# Patient Record
Sex: Male | Born: 2000 | Race: Black or African American | Hispanic: No | Marital: Single | Smoking: Current every day smoker
Health system: Southern US, Community
[De-identification: ages and names within clinical notes are randomized; demographics above are authoritative.]

---
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
## 2024-06-06 ENCOUNTER — Encounter (HOSPITAL_COMMUNITY): Payer: Self-pay | Admitting: *Deleted

## 2024-06-06 ENCOUNTER — Ambulatory Visit (HOSPITAL_COMMUNITY): Admission: EM | Admit: 2024-06-06 | Discharge: 2024-06-06 | Disposition: A | Discharge status: Home / Self Care

## 2024-06-06 DIAGNOSIS — K5900 Constipation, unspecified: Secondary | ICD-10-CM

## 2024-06-06 MED ORDER — POLYETHYLENE GLYCOL 3350 17 G PO PACK: 17.0000 g | PACK | Freq: Every day | ORAL | 0 refills | Status: AC

## 2024-06-06 NOTE — ED Triage Notes (Signed)
 Pt states no BM x 2 days and couldn't sleep last night. Today his mom made him some tea which produced a small BM. He would like something to be sent to the pharmacy to clean him out.

## 2024-06-06 NOTE — Discharge Instructions (Signed)
" °  1. Constipation, unspecified constipation type (Primary) - polyethylene glycol (MIRALAX ) 17 g packet; Take 17 g by mouth daily.  Dispense: 14 each; Refill: 0 - You can also pick up at the pharmacy saline enema or soapsuds enema to help lubricate any hardened stool that may be in the rectal vault to prevent increased pain from trying to pass hard stool. -Drink plenty of water, eat high-fiber foods and go to the restroom when you feel the urge to defecate do not hold stool as this can increase constipation.  -Continue to monitor symptoms for any change in severity if there is any escalation of current symptoms or development of new symptoms follow-up in ER for further evaluation and management. "

## 2024-06-06 NOTE — ED Provider Notes (Signed)
 " [facility]   Note:  This document was prepared using Dragon voice recognition software and may include unintentional dictation errors.  MRN: [id number] DOB: 07-29-2000  Subjective:   Benjamin Barber is a 24 y.o. male presenting for evaluation of constipation x 2 days.  Patient reports that he was unable to sleep last night due to abdominal discomfort from constipation and gas.  Patient tried to eat drinking some tea that his mom gave him and had a small bowel movement but still feels bloated and full.  Patient was hoping to be prescribed some kind of medication to help him use the restroom.  Patient has not tried any over-the-counter medications to treat symptoms prior to arrival in urgent care.  Denies any past history of constipation or GI issues.  Current Medications[1]   Allergies[2]  History reviewed. No pertinent past medical history.   History reviewed. No pertinent surgical history.  History reviewed. No pertinent family history.  Social History[3]  ROS Refer to HPI for ROS details.  Objective:    Vitals: BP (!) 159/94 (BP Location: Left Arm)   Pulse (!) 57   Temp 98.7 F (37.1 C) (Oral)   Resp 14   SpO2 98%   Physical Exam Vitals and nursing note reviewed.  Constitutional:      General: He is not in acute distress.    Appearance: He is well-developed. He is not ill-appearing or toxic-appearing.  HENT:     Head: Normocephalic.  Cardiovascular:     Rate and Rhythm: Normal rate.  Pulmonary:     Effort: Pulmonary effort is normal. No respiratory distress.     Breath sounds: No stridor. No wheezing.  Abdominal:     General: There is no distension.     Palpations: Abdomen is soft. There is no mass.     Tenderness: There is no abdominal tenderness. There is no right CVA tenderness, left CVA tenderness, guarding or rebound.  Skin:    General: Skin is warm and dry.  Neurological:     General: No focal deficit present.     Mental Status: He is  alert and oriented to person, place, and time.  Psychiatric:        Mood and Affect: Mood normal.        Behavior: Behavior normal.     Procedures  No results found for this or any previous visit (from the past 24 hours).  Assessment and Plan :     Discharge Instructions       1. Constipation, unspecified constipation type (Primary) - polyethylene glycol (MIRALAX ) 17 g packet; Take 17 g by mouth daily.  Dispense: 14 each; Refill: 0 - You can also pick up at the pharmacy saline enema or soapsuds enema to help lubricate any hardened stool that may be in the rectal vault to prevent increased pain from trying to pass hard stool. -Drink plenty of water, eat high-fiber foods and go to the restroom when you feel the urge to defecate do not hold stool as this can increase constipation.  -Continue to monitor symptoms for any change in severity if there is any escalation of current symptoms or development of new symptoms follow-up in ER for further evaluation and management.      Tatum Massman B Sebastien Jackson    [1] No current facility-administered medications for this encounter.  Current Outpatient Medications:    polyethylene glycol (MIRALAX ) 17 g packet, Take 17 g by mouth daily., Disp: 14 each, Rfl: 0   doxycycline  (  VIBRAMYCIN ) 100 MG capsule, Take 1 capsule (100 mg total) by mouth 2 (two) times daily., Disp: 20 capsule, Rfl: 0 [2] No Known Allergies [3]  Social History Tobacco Use   Smoking status: Every Day    Types: Cigarettes   Smokeless tobacco: Never  Vaping Use   Vaping status: Every Day  Substance Use Topics   Alcohol use: Yes   Drug use: Yes    Types: Marijuana     Aurea Goodell B, NP 06/06/24 1825  "
